# Patient Record
Sex: Female | Born: 2000 | Race: Black or African American | Hispanic: No | Marital: Single | Smoking: Never smoker
Health system: Southern US, Community
[De-identification: ages and names within clinical notes are randomized; demographics above are authoritative.]

## PROBLEM LIST (undated history)

## (undated) DIAGNOSIS — F419 Anxiety disorder, unspecified: Secondary | ICD-10-CM

## (undated) DIAGNOSIS — G43909 Migraine, unspecified, not intractable, without status migrainosus: Secondary | ICD-10-CM

## (undated) DIAGNOSIS — K219 Gastro-esophageal reflux disease without esophagitis: Secondary | ICD-10-CM

## (undated) DIAGNOSIS — F32A Depression, unspecified: Secondary | ICD-10-CM

---
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
## 2024-01-29 ENCOUNTER — Emergency Department: Admission: EM | Admit: 2024-01-29 | Discharge: 2024-01-29 | Discharge status: Against Medical Advice

## 2024-01-29 ENCOUNTER — Other Ambulatory Visit: Payer: Self-pay

## 2024-01-29 ENCOUNTER — Encounter: Payer: Self-pay | Admitting: Emergency Medicine

## 2024-01-29 DIAGNOSIS — Z5321 Procedure and treatment not carried out due to patient leaving prior to being seen by health care provider: Secondary | ICD-10-CM | POA: Insufficient documentation

## 2024-01-29 DIAGNOSIS — K920 Hematemesis: Secondary | ICD-10-CM | POA: Diagnosis present

## 2024-01-29 HISTORY — DX: Anxiety disorder, unspecified: F41.9

## 2024-01-29 HISTORY — DX: Depression, unspecified: F32.A

## 2024-01-29 LAB — COMPREHENSIVE METABOLIC PANEL WITH GFR
ALT: 17 U/L (ref 0–44)
AST: 42 U/L — ABNORMAL HIGH (ref 15–41)
Albumin: 5.1 g/dL — ABNORMAL HIGH (ref 3.5–5.0)
Alkaline Phosphatase: 60 U/L (ref 38–126)
Anion gap: 14 (ref 5–15)
BUN: 8 mg/dL (ref 6–20)
CO2: 25 mmol/L (ref 22–32)
Calcium: 9.2 mg/dL (ref 8.9–10.3)
Chloride: 98 mmol/L (ref 98–111)
Creatinine, Ser: 0.87 mg/dL (ref 0.44–1.00)
GFR, Estimated: >60 mL/min (ref >60)
Glucose, Bld: 92 mg/dL (ref 70–99)
Potassium: 3.7 mmol/L (ref 3.5–5.1)
Sodium: 137 mmol/L (ref 135–145)
Total Bilirubin: 0.9 mg/dL (ref 0.0–1.2)
Total Protein: 8.8 g/dL — ABNORMAL HIGH (ref 6.5–8.1)

## 2024-01-29 LAB — CBC
HCT: 40.1 % (ref 36.0–46.0)
Hemoglobin: 13.7 g/dL (ref 12.0–15.0)
MCH: 32.5 pg (ref 26.0–34.0)
MCHC: 34.2 g/dL (ref 30.0–36.0)
MCV: 95.2 fL (ref 80.0–100.0)
Platelets: 370 10*3/uL (ref 150–400)
RBC: 4.21 MIL/uL (ref 3.87–5.11)
RDW: 13.9 % (ref 11.5–15.5)
WBC: 6.1 10*3/uL (ref 4.0–10.5)
nRBC: 0 % (ref 0.0–0.2)

## 2024-01-29 LAB — LIPASE, BLOOD: Lipase: 31 U/L (ref 11–51)

## 2024-01-29 NOTE — ED Triage Notes (Signed)
 Patient to ED via POV for intermittent vomiting with blood. States ongoing for the past year and states she feels like she is "not eating enough." Still able to eat and drink. NAD noted.

## 2024-01-29 NOTE — ED Notes (Signed)
 No answer x2 when called for a room - not visualized in the lobby.
